# Patient Record
Sex: Female | Born: 1980 | Race: White | Hispanic: No | Marital: Single | Smoking: Never smoker
Health system: Southern US, Community
[De-identification: ages and names within clinical notes are randomized; demographics above are authoritative.]

---

## 2016-08-17 ENCOUNTER — Encounter: Payer: Self-pay | Admitting: Family Medicine

## 2016-08-17 ENCOUNTER — Ambulatory Visit (INDEPENDENT_AMBULATORY_CARE_PROVIDER_SITE_OTHER): Payer: PRIVATE HEALTH INSURANCE | Admitting: Family Medicine

## 2016-08-17 ENCOUNTER — Ambulatory Visit (INDEPENDENT_AMBULATORY_CARE_PROVIDER_SITE_OTHER): Payer: PRIVATE HEALTH INSURANCE

## 2016-08-17 VITALS — BP 131/91 | HR 80 | Temp 97.3°F | Ht 62.0 in | Wt 167.0 lb

## 2016-08-17 DIAGNOSIS — R1030 Lower abdominal pain, unspecified: Secondary | ICD-10-CM

## 2016-08-17 DIAGNOSIS — R319 Hematuria, unspecified: Secondary | ICD-10-CM | POA: Diagnosis not present

## 2016-08-17 LAB — MICROSCOPIC EXAMINATION: RENAL EPITHEL UA: NONE SEEN /HPF

## 2016-08-17 LAB — URINALYSIS, COMPLETE
Bilirubin, UA: NEGATIVE
Glucose, UA: NEGATIVE
Leukocytes, UA: NEGATIVE
NITRITE UA: NEGATIVE
PH UA: 5 (ref 5.0–7.5)
Specific Gravity, UA: 1.03 (ref 1.005–1.030)
Urobilinogen, Ur: 0.2 mg/dL (ref 0.2–1.0)

## 2016-08-17 MED ORDER — HYOSCYAMINE SULFATE 0.125 MG PO TBDP: 0.1250 mg | ORAL_TABLET | ORAL | 5 refills | Status: DC | PRN

## 2016-08-17 NOTE — Progress Notes (Signed)
Subjective:  Patient ID: Jillian Galvan, female    DOB: 1981-07-26  Age: 35 y.o. MRN: [id number]  CC: Hematuria and Abdominal Pain   HPI Jillian Galvan presents for For 5 days of noting blood in her urine. There is some blood on the toilet paper when she wipes and the urine is dark. She denies frequency or dysuria. No urgency. Her periods have been normal last period was one half weeks ago. They have been regular as well. Bowel movements normal. Denies diarrhea constipation. The pain has been a dull ache intermittently in the suprapubic region. These are occasional but radiates into the bilateral flank regions. Patient states she has a high pain tolerance. She mentions this is more of a discomfort than a pain.  History Jillian Galvan has no past medical history on file.   She has no past surgical history on file.   Her family history is not on file.She reports that she has never smoked. She has never used smokeless tobacco. Her alcohol and drug histories are not on file.  No current outpatient prescriptions on file prior to visit.   No current facility-administered medications on file prior to visit.     ROS Review of Systems  Constitutional: Negative for activity change, appetite change and fever.  HENT: Negative for congestion, rhinorrhea and sore throat.   Eyes: Negative for visual disturbance.  Respiratory: Negative for cough and shortness of breath.   Cardiovascular: Negative for chest pain and palpitations.  Gastrointestinal: Negative for abdominal pain, diarrhea and nausea.  Genitourinary: Negative for dysuria.  Musculoskeletal: Negative for arthralgias and myalgias.    Objective:  BP (!) 131/91   Pulse 80   Temp 97.3 F (36.3 C) (Oral)   Ht '5\' 2"'  (1.575 m)   Wt 167 lb (75.8 kg)   BMI 30.54 kg/m   Physical Exam  Constitutional: She is oriented to person, place, and time. She appears well-developed and well-nourished.  HENT:  Head: Normocephalic and atraumatic.    Cardiovascular: Normal rate and regular rhythm.   No murmur heard. Pulmonary/Chest: Effort normal and breath sounds normal. No respiratory distress. She has no wheezes.  Abdominal: Soft. Bowel sounds are normal. She exhibits no distension and no mass. There is tenderness (mild at LUQ & LLQ). There is no rebound and no guarding.  Neurological: She is alert and oriented to person, place, and time.  Skin: Skin is warm and dry.  Psychiatric: She has a normal mood and affect. Her behavior is normal.    Assessment & Plan:   Reshma was seen today for hematuria and abdominal pain.  Diagnoses and all orders for this visit:  Hematuria, unspecified type -     Urinalysis, Complete -     CBC with Differential/Platelet  Lower abdominal pain -     DG Abd 2 Views; Future -     CBC with Differential/Platelet -     CMP14+EGFR  Other orders -     hyoscyamine (ANASPAZ) 0.125 MG TBDP disintergrating tablet; Place 1 tablet (0.125 mg total) under the tongue every 4 (four) hours as needed for cramping (or bloating).   I am having Ms. Simmon start on hyoscyamine.  Meds ordered this encounter  Medications  . hyoscyamine (ANASPAZ) 0.125 MG TBDP disintergrating tablet    Sig: Place 1 tablet (0.125 mg total) under the tongue every 4 (four) hours as needed for cramping (or bloating).    Dispense:  25 tablet    Refill:  5   Reassurred regarding abd  pain.   Follow-up: Return in about 1 month (around 09/16/2016) for abd pain.  [name], M.D.

## 2016-08-18 LAB — CBC WITH DIFFERENTIAL/PLATELET
BASOS ABS: 0 10*3/uL (ref 0.0–0.2)
BASOS: 0 %
EOS (ABSOLUTE): 0.1 10*3/uL (ref 0.0–0.4)
Eos: 3 %
HEMOGLOBIN: 14.1 g/dL (ref 11.1–15.9)
Hematocrit: 41.3 % (ref 34.0–46.6)
IMMATURE GRANS (ABS): 0 10*3/uL (ref 0.0–0.1)
IMMATURE GRANULOCYTES: 0 %
LYMPHS: 30 %
Lymphocytes Absolute: 1.7 10*3/uL (ref 0.7–3.1)
MCH: 29.2 pg (ref 26.6–33.0)
MCHC: 34.1 g/dL (ref 31.5–35.7)
MCV: 86 fL (ref 79–97)
MONOCYTES: 15 %
Monocytes Absolute: 0.9 10*3/uL (ref 0.1–0.9)
NEUTROS PCT: 52 %
Neutrophils Absolute: 3 10*3/uL (ref 1.4–7.0)
PLATELETS: 203 10*3/uL (ref 150–379)
RBC: 4.83 x10E6/uL (ref 3.77–5.28)
RDW: 13.4 % (ref 12.3–15.4)
WBC: 5.7 10*3/uL (ref 3.4–10.8)

## 2016-08-18 LAB — CMP14+EGFR
ALBUMIN: 4.4 g/dL (ref 3.5–5.5)
ALT: 51 IU/L — AB (ref 0–32)
AST: 32 IU/L (ref 0–40)
Albumin/Globulin Ratio: 1.8 (ref 1.2–2.2)
Alkaline Phosphatase: 91 IU/L (ref 39–117)
BUN/Creatinine Ratio: 21 (ref 9–23)
BUN: 16 mg/dL (ref 6–20)
Bilirubin Total: 0.4 mg/dL (ref 0.0–1.2)
CALCIUM: 9.2 mg/dL (ref 8.7–10.2)
CO2: 24 mmol/L (ref 18–29)
CREATININE: 0.77 mg/dL (ref 0.57–1.00)
Chloride: 101 mmol/L (ref 96–106)
GFR, EST AFRICAN AMERICAN: 117 mL/min/{1.73_m2} (ref >59)
GFR, EST NON AFRICAN AMERICAN: 102 mL/min/{1.73_m2} (ref >59)
GLUCOSE: 87 mg/dL (ref 65–99)
Globulin, Total: 2.5 g/dL (ref 1.5–4.5)
Potassium: 4.1 mmol/L (ref 3.5–5.2)
Sodium: 140 mmol/L (ref 134–144)
TOTAL PROTEIN: 6.9 g/dL (ref 6.0–8.5)

## 2018-04-26 IMAGING — DX DG ABDOMEN 2V
2 series · 2 of 2 positions shown · non-contrast
Comparison: None.

CLINICAL DATA: Intermittent abdominal pain over the last 2 months

EXAM:
ABDOMEN - 2 VIEW

[abdomen erect]
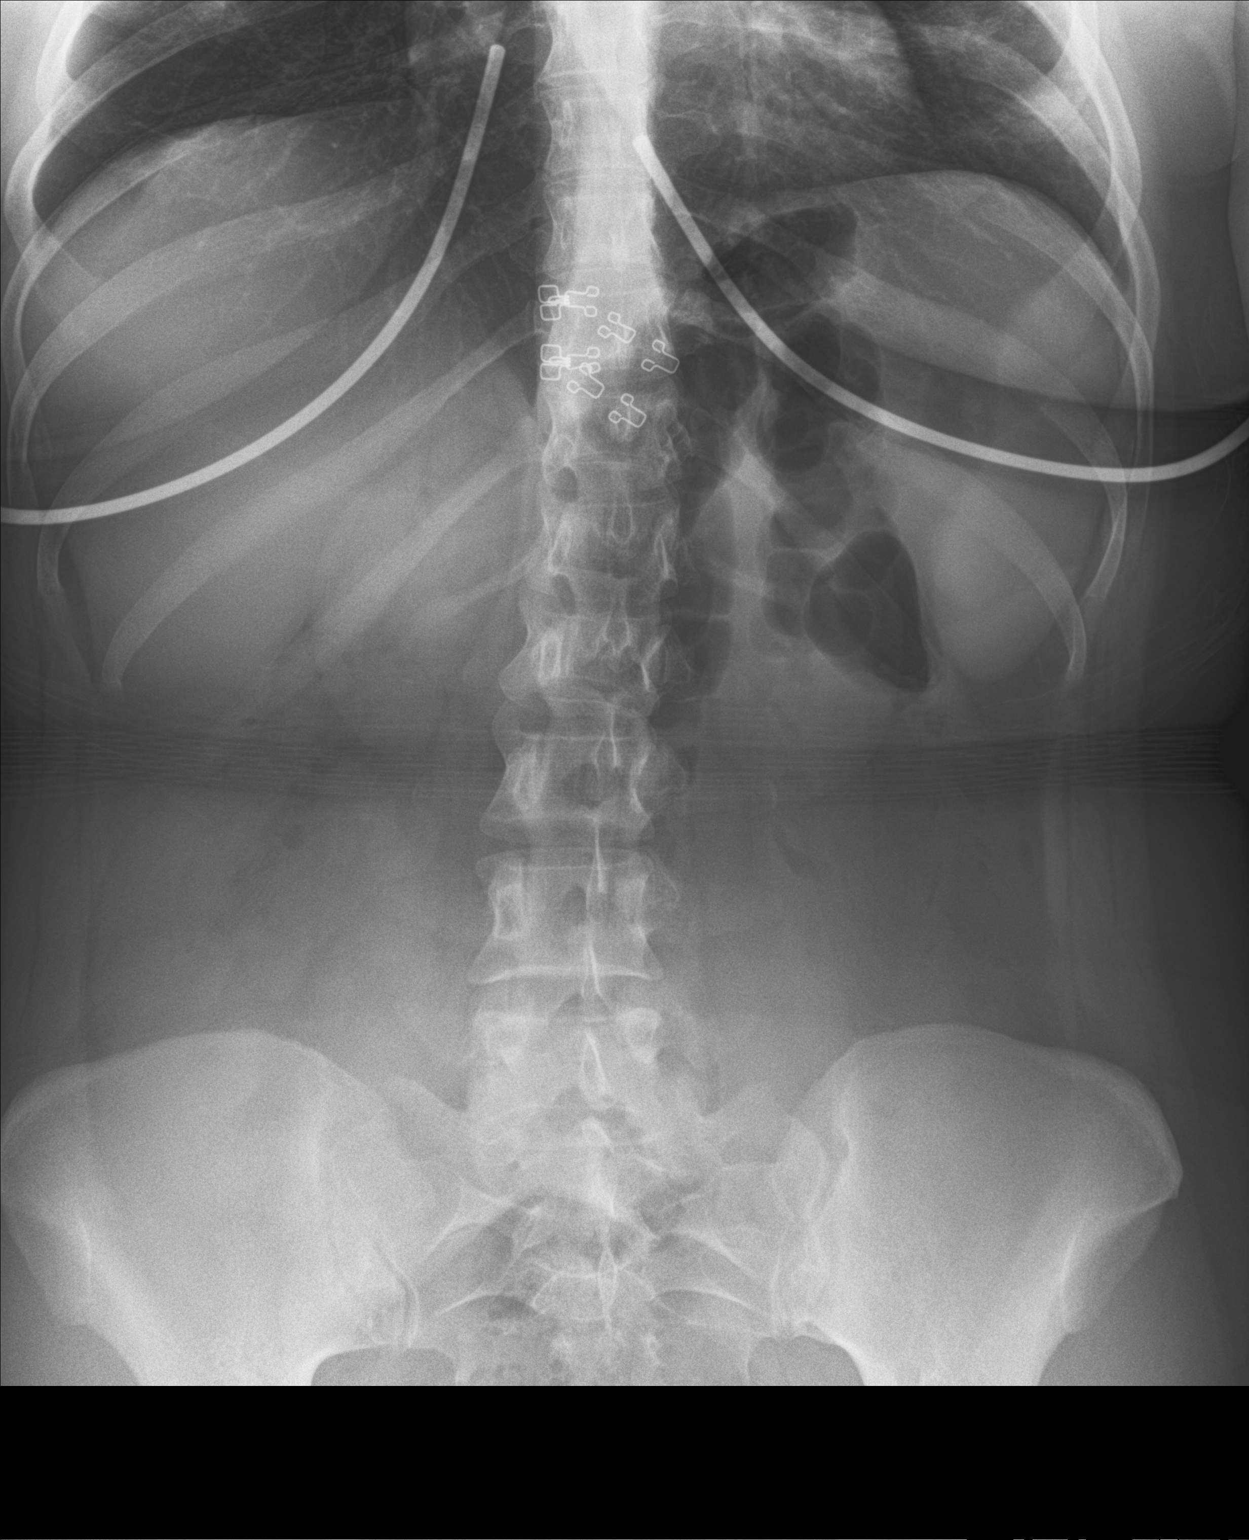

[abdomen supine]
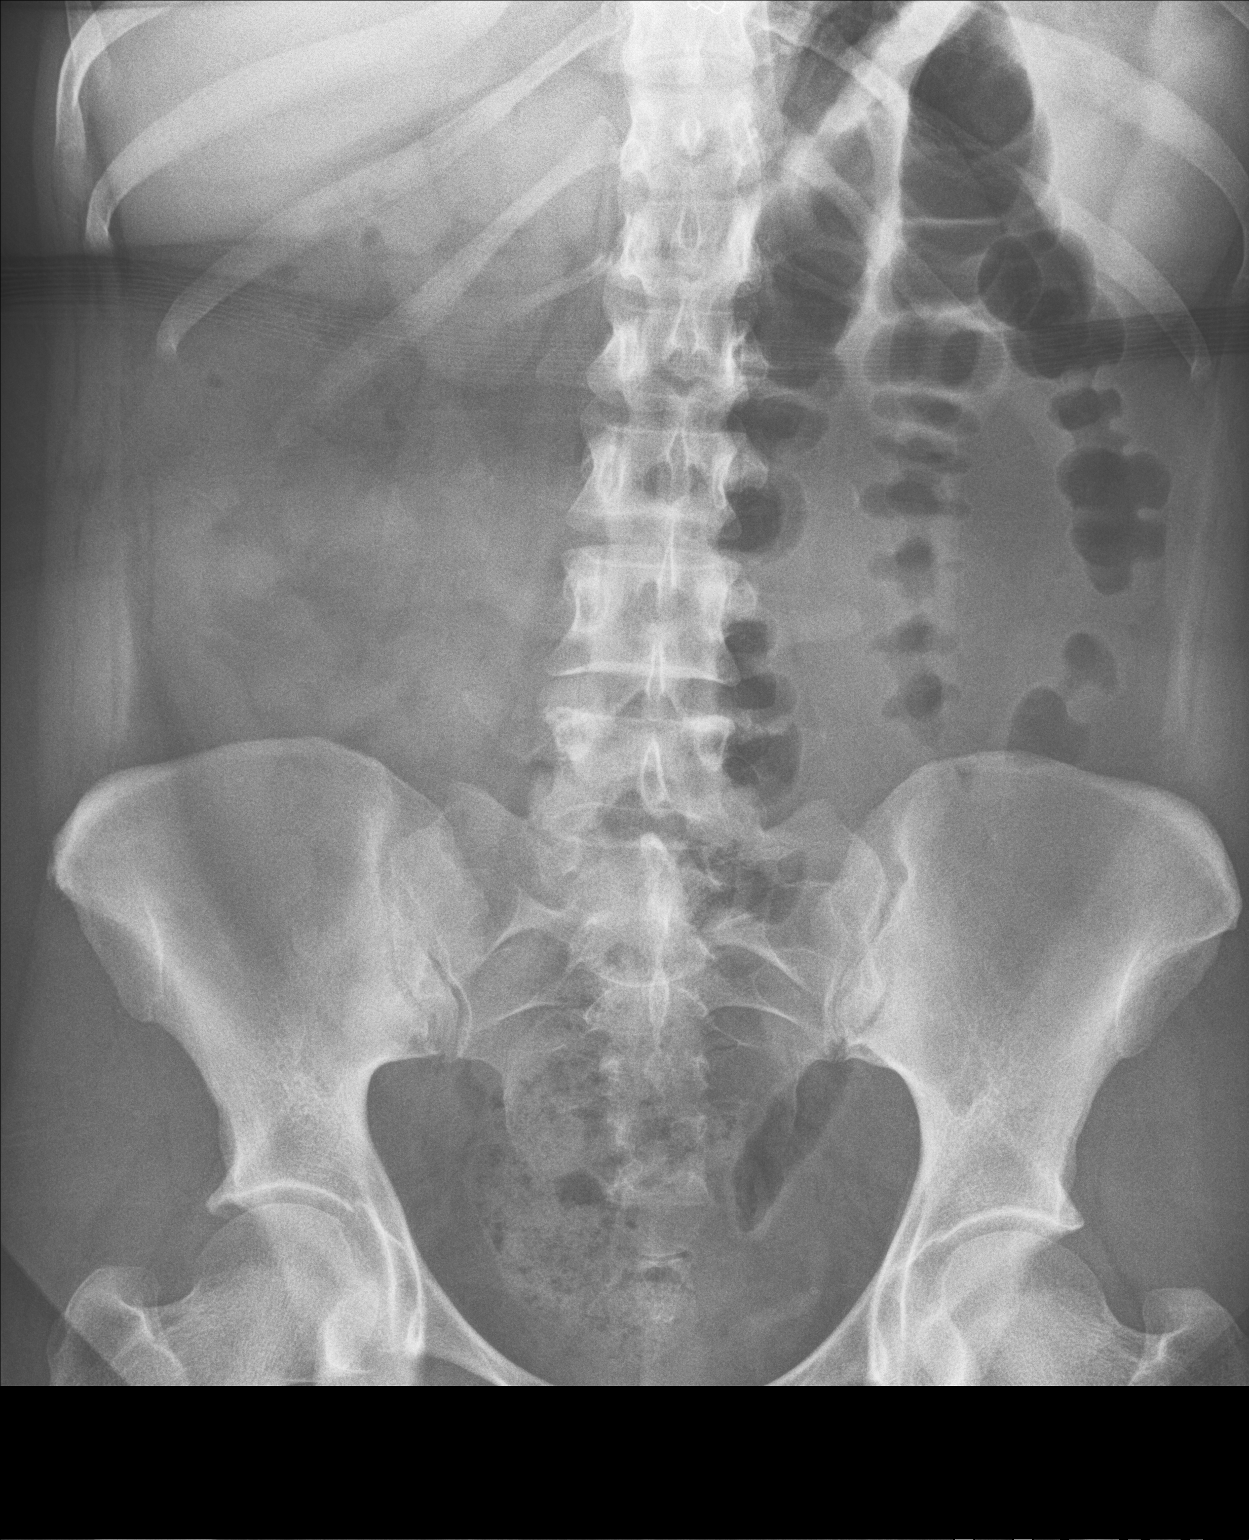

[2 of 2 positions shown; findings below may reference images not displayed]

FINDINGS: Supine and erect views the abdomen show no bowel obstruction. No
free air seen on the erect view. No opaque calculi are noted. The
bones are unremarkable.
IMPRESSION: No bowel obstruction.  No free air.

## 2018-07-06 ENCOUNTER — Encounter: Payer: Self-pay | Admitting: Pediatrics

## 2018-07-06 ENCOUNTER — Ambulatory Visit (INDEPENDENT_AMBULATORY_CARE_PROVIDER_SITE_OTHER): Payer: PRIVATE HEALTH INSURANCE | Admitting: Pediatrics

## 2018-07-06 VITALS — BP 125/84 | HR 77 | Temp 98.4°F | Ht 62.0 in | Wt 169.4 lb

## 2018-07-06 DIAGNOSIS — Z0000 Encounter for general adult medical examination without abnormal findings: Secondary | ICD-10-CM

## 2018-07-06 DIAGNOSIS — Z0001 Encounter for general adult medical examination with abnormal findings: Secondary | ICD-10-CM

## 2018-07-06 DIAGNOSIS — N920 Excessive and frequent menstruation with regular cycle: Secondary | ICD-10-CM | POA: Diagnosis not present

## 2018-07-06 DIAGNOSIS — Z6830 Body mass index (BMI) 30.0-30.9, adult: Secondary | ICD-10-CM

## 2018-07-06 LAB — WET PREP FOR TRICH, YEAST, CLUE
CLUE CELL EXAM: POSITIVE — AB
TRICHOMONAS EXAM: NEGATIVE
YEAST EXAM: NEGATIVE

## 2018-07-06 LAB — PREGNANCY, URINE: PREG TEST UR: NEGATIVE

## 2018-07-06 MED ORDER — NORGESTIMATE-ETH ESTRADIOL 0.25-35 MG-MCG PO TABS: 1.0000 | ORAL_TABLET | Freq: Every day | ORAL | 3 refills | Status: DC

## 2018-07-06 NOTE — Progress Notes (Signed)
  Subjective:   Patient ID: Jillian Galvan, female    DOB: 05-13-1981, 37 y.o.   MRN: [id number] CC: Annual Exam and Gynecologic Exam  HPI: Jillian Galvan is a 37 y.o. female   LMP: 3 weeks ago, heavy periods.  Interested in starting birth control to help with menorrhagia.  No personal history of migraines, no personal or family history of blood clots  Has been feeling well overall.  No fevers, trying to stay active.  PGM: breast cancer at age 46yo  Relevant past medical, surgical, family and social history reviewed. Allergies and medications reviewed and updated. Social History   Tobacco Use  Smoking Status Never Smoker  Smokeless Tobacco Never Used   ROS: All systems negative other than what is in the HPI  Objective:    BP 125/84   Pulse 77   Temp 98.4 F (36.9 C) (Oral)   Ht '5\' 2"'$  (1.575 m)   Wt 169 lb 6.4 oz (76.8 kg)   LMP 06/15/2018   BMI 30.98 kg/m   Wt Readings from Last 3 Encounters:  07/06/18 169 lb 6.4 oz (76.8 kg)  08/17/16 167 lb (75.8 kg)    Gen: NAD, alert, cooperative with exam, NCAT EYES: EOMI, no conjunctival injection, or no icterus ENT:  TMs pearly gray b/l, OP without erythema LYMPH: no cervical LAD CV: NRRR, normal S1/S2, no murmur, distal pulses 2+ b/l Resp: CTABL, no wheezes, normal WOB Abd: +BS, soft, NTND. no guarding or organomegaly Ext: No edema, warm Neuro: Alert and oriented, strength equal b/l UE and LE, coordination grossly normal MSK: normal muscle bulk GU: Normal external female genitalia, small to moderate amount of white thick discharge in vaginal vault.  Normal-appearing cervix.  Assessment & Plan:  Jillian Galvan was seen today for annual exam and gynecologic exam.  Diagnoses and all orders for this visit:  Encounter for preventive care -     CMP14+EGFR -     CBC with Differential/Platelet -     TSH -     Pap IG and HPV (high risk) DNA detection -     WET PREP FOR TRICH, YEAST, CLUE  Menorrhagia with regular cycle Start below.  Not  interested in pregnancy at this time, patient is sexually active.  Side effects discussed. -     norgestimate-ethinyl estradiol (ORTHO-CYCLEN, 28,) 0.25-35 MG-MCG tablet; Take 1 tablet by mouth daily. -     Pregnancy, urine -     WET PREP FOR TRICH, YEAST, CLUE  BMI 30.0-30.9,adult Continue lifestyle modifications, avoiding sugary drinks  Follow up plan: Return in about 1 year (around 07/07/2019). Assunta Found, MD [place]

## 2018-07-07 LAB — CBC WITH DIFFERENTIAL/PLATELET
BASOS: 0 %
Basophils Absolute: 0 10*3/uL (ref 0.0–0.2)
EOS (ABSOLUTE): 0.1 10*3/uL (ref 0.0–0.4)
EOS: 1 %
HEMATOCRIT: 40.3 % (ref 34.0–46.6)
HEMOGLOBIN: 13.6 g/dL (ref 11.1–15.9)
Immature Grans (Abs): 0 10*3/uL (ref 0.0–0.1)
Immature Granulocytes: 0 %
LYMPHS ABS: 2.3 10*3/uL (ref 0.7–3.1)
Lymphs: 20 %
MCH: 30.1 pg (ref 26.6–33.0)
MCHC: 33.7 g/dL (ref 31.5–35.7)
MCV: 89 fL (ref 79–97)
MONOS ABS: 0.7 10*3/uL (ref 0.1–0.9)
Monocytes: 6 %
NEUTROS ABS: 8.5 10*3/uL — AB (ref 1.4–7.0)
Neutrophils: 73 %
Platelets: 245 10*3/uL (ref 150–450)
RBC: 4.52 x10E6/uL (ref 3.77–5.28)
RDW: 12.6 % (ref 12.3–15.4)
WBC: 11.7 10*3/uL — AB (ref 3.4–10.8)

## 2018-07-07 LAB — CMP14+EGFR
ALBUMIN: 4.1 g/dL (ref 3.5–5.5)
ALK PHOS: 81 IU/L (ref 39–117)
ALT: 29 IU/L (ref 0–32)
AST: 23 IU/L (ref 0–40)
Albumin/Globulin Ratio: 1.7 (ref 1.2–2.2)
BUN / CREAT RATIO: 24 — AB (ref 9–23)
BUN: 20 mg/dL (ref 6–20)
Bilirubin Total: <0.2 mg/dL (ref 0.0–1.2)
CO2: 23 mmol/L (ref 20–29)
CREATININE: 0.85 mg/dL (ref 0.57–1.00)
Calcium: 9.1 mg/dL (ref 8.7–10.2)
Chloride: 105 mmol/L (ref 96–106)
GFR, EST AFRICAN AMERICAN: 103 mL/min/{1.73_m2} (ref >59)
GFR, EST NON AFRICAN AMERICAN: 90 mL/min/{1.73_m2} (ref >59)
GLOBULIN, TOTAL: 2.4 g/dL (ref 1.5–4.5)
Glucose: 79 mg/dL (ref 65–99)
Potassium: 4.6 mmol/L (ref 3.5–5.2)
SODIUM: 141 mmol/L (ref 134–144)
TOTAL PROTEIN: 6.5 g/dL (ref 6.0–8.5)

## 2018-07-07 LAB — TSH: TSH: 1.44 u[IU]/mL (ref 0.450–4.500)

## 2018-07-10 ENCOUNTER — Other Ambulatory Visit: Payer: Self-pay | Admitting: Pediatrics

## 2018-07-10 DIAGNOSIS — N760 Acute vaginitis: Principal | ICD-10-CM

## 2018-07-10 DIAGNOSIS — B9689 Other specified bacterial agents as the cause of diseases classified elsewhere: Secondary | ICD-10-CM

## 2018-07-10 MED ORDER — METRONIDAZOLE 500 MG PO TABS: 500.0000 mg | ORAL_TABLET | Freq: Two times a day (BID) | ORAL | 0 refills | Status: DC

## 2018-07-11 LAB — PAP IG AND HPV HIGH-RISK
HPV, HIGH-RISK: NEGATIVE
PAP SMEAR COMMENT: 0

## 2018-10-16 ENCOUNTER — Other Ambulatory Visit: Payer: Self-pay | Admitting: Pediatrics

## 2018-10-16 DIAGNOSIS — N920 Excessive and frequent menstruation with regular cycle: Secondary | ICD-10-CM

## 2019-01-12 ENCOUNTER — Other Ambulatory Visit: Payer: Self-pay | Admitting: Pediatrics

## 2019-01-12 DIAGNOSIS — N920 Excessive and frequent menstruation with regular cycle: Secondary | ICD-10-CM

## 2019-01-28 ENCOUNTER — Other Ambulatory Visit: Payer: Self-pay

## 2019-01-28 ENCOUNTER — Ambulatory Visit (INDEPENDENT_AMBULATORY_CARE_PROVIDER_SITE_OTHER): Payer: PRIVATE HEALTH INSURANCE | Admitting: Family Medicine

## 2019-01-28 VITALS — Temp 98.2°F

## 2019-01-28 DIAGNOSIS — K219 Gastro-esophageal reflux disease without esophagitis: Secondary | ICD-10-CM

## 2019-01-28 MED ORDER — PANTOPRAZOLE SODIUM 40 MG PO TBEC: 40.0000 mg | DELAYED_RELEASE_TABLET | Freq: Every day | ORAL | 1 refills | Status: DC

## 2019-01-28 NOTE — Progress Notes (Signed)
Telephone visit  Subjective: CC: stomach pain PCP: Raliegh Ip, DO BEE:FEOFH [name] is a 38 y.o. female calls for telephone consult today. Patient provides verbal consent for consult held via phone.  Location of patient: home Location of provider: [facility] Others present for call: none  1. Stomach pain Patient reports that she developed epigastric pain, described as a burning in her stomach with associated nausea yesterday morning.  She called out of work and subsequently is contacting me for medical clearance.  She reports having felt somewhat chilled during the pain episode but that since resolved.  No measured fevers.  Her temperature was 98.2 F today.  She has been using Tums and Pepcid but this does not seem to help.  She has a medical history significant for acid reflux symptoms but again has always used over-the-counter medications that are now not helping.  She had dry heaving but no actual vomiting.  No hematochezia or melena.  No undercooked foods, foods left out too long or untreated water.  No sick contacts.  Last menstrual cycle was 01/17/2019.  She uses OCPs consistently for contraception.   ROS: Per HPI  Allergies  Allergen Reactions  . Penicillins    No past medical history on file.  Current Outpatient Medications:  .  SPRINTEC 28 0.25-35 MG-MCG tablet, TAKE 1 TABLET BY MOUTH EVERY DAY, Disp: 84 tablet, Rfl: 1  Temperature 98.2 F (36.8 C), last menstrual period 01/16/2019.  Assessment/ Plan: 38 y.o. female   1. Gastroesophageal reflux disease without esophagitis Start pantoprazole 40 mg daily.  Instructed her to use this for 2 weeks.  She may then discontinue.  If symptoms resolve, okay to restart intake daily.  She will follow-up PRN - pantoprazole (PROTONIX) 40 MG tablet; Take 1 tablet (40 mg total) by mouth daily.  Dispense: 30 tablet; Refill: 1   Start time: 2:39pm End time: 2:47pm  Total time spent on patient care (including telephone call/ virtual  visit): 15 minutes  Jillian Musgrave Hulen Skains, DO [facility] [phone]

## 2019-02-19 ENCOUNTER — Other Ambulatory Visit: Payer: Self-pay | Admitting: Family Medicine

## 2019-02-19 DIAGNOSIS — K219 Gastro-esophageal reflux disease without esophagitis: Secondary | ICD-10-CM

## 2019-02-19 MED ORDER — PANTOPRAZOLE SODIUM 40 MG PO TBEC: 40.0000 mg | DELAYED_RELEASE_TABLET | Freq: Every day | ORAL | 0 refills | Status: DC

## 2019-02-19 NOTE — Telephone Encounter (Signed)
Refill failed, resent 

## 2019-02-19 NOTE — Addendum Note (Signed)
Addended by: Julious Payer D on: 02/19/2019 04:41 PM   Modules accepted: Orders

## 2021-08-04 DIAGNOSIS — J020 Streptococcal pharyngitis: Secondary | ICD-10-CM | POA: Diagnosis not present

## 2021-08-04 DIAGNOSIS — J101 Influenza due to other identified influenza virus with other respiratory manifestations: Secondary | ICD-10-CM | POA: Diagnosis not present

## 2021-08-04 DIAGNOSIS — Z6830 Body mass index (BMI) 30.0-30.9, adult: Secondary | ICD-10-CM | POA: Diagnosis not present

## 2022-01-12 DIAGNOSIS — J0100 Acute maxillary sinusitis, unspecified: Secondary | ICD-10-CM | POA: Diagnosis not present

## 2022-01-12 DIAGNOSIS — H6983 Other specified disorders of Eustachian tube, bilateral: Secondary | ICD-10-CM | POA: Diagnosis not present

## 2022-01-12 DIAGNOSIS — Z6832 Body mass index (BMI) 32.0-32.9, adult: Secondary | ICD-10-CM | POA: Diagnosis not present

## 2022-02-10 DIAGNOSIS — Z6830 Body mass index (BMI) 30.0-30.9, adult: Secondary | ICD-10-CM | POA: Diagnosis not present

## 2022-02-10 DIAGNOSIS — R070 Pain in throat: Secondary | ICD-10-CM | POA: Diagnosis not present

## 2022-02-10 DIAGNOSIS — J0100 Acute maxillary sinusitis, unspecified: Secondary | ICD-10-CM | POA: Diagnosis not present

## 2022-02-10 DIAGNOSIS — J020 Streptococcal pharyngitis: Secondary | ICD-10-CM | POA: Diagnosis not present

## 2022-06-09 DIAGNOSIS — Z6832 Body mass index (BMI) 32.0-32.9, adult: Secondary | ICD-10-CM | POA: Diagnosis not present

## 2022-06-09 DIAGNOSIS — J00 Acute nasopharyngitis [common cold]: Secondary | ICD-10-CM | POA: Diagnosis not present

## 2022-10-25 DIAGNOSIS — K219 Gastro-esophageal reflux disease without esophagitis: Secondary | ICD-10-CM | POA: Diagnosis not present

## 2022-10-25 DIAGNOSIS — R109 Unspecified abdominal pain: Secondary | ICD-10-CM | POA: Diagnosis not present

## 2022-10-25 DIAGNOSIS — R110 Nausea: Secondary | ICD-10-CM | POA: Diagnosis not present

## 2022-10-25 DIAGNOSIS — K760 Fatty (change of) liver, not elsewhere classified: Secondary | ICD-10-CM | POA: Diagnosis not present

## 2022-10-25 DIAGNOSIS — Z880 Allergy status to penicillin: Secondary | ICD-10-CM | POA: Diagnosis not present

## 2022-10-25 DIAGNOSIS — R1012 Left upper quadrant pain: Secondary | ICD-10-CM | POA: Diagnosis not present

## 2022-11-10 DIAGNOSIS — T7840XA Allergy, unspecified, initial encounter: Secondary | ICD-10-CM | POA: Diagnosis not present

## 2023-01-16 DIAGNOSIS — J0100 Acute maxillary sinusitis, unspecified: Secondary | ICD-10-CM | POA: Diagnosis not present

## 2023-01-16 DIAGNOSIS — J029 Acute pharyngitis, unspecified: Secondary | ICD-10-CM | POA: Diagnosis not present

## 2023-04-04 DIAGNOSIS — Z880 Allergy status to penicillin: Secondary | ICD-10-CM | POA: Diagnosis not present

## 2023-04-04 DIAGNOSIS — M79671 Pain in right foot: Secondary | ICD-10-CM | POA: Diagnosis not present

## 2023-04-06 ENCOUNTER — Ambulatory Visit (INDEPENDENT_AMBULATORY_CARE_PROVIDER_SITE_OTHER): Payer: BC Managed Care – PPO | Admitting: Family Medicine

## 2023-04-06 ENCOUNTER — Encounter: Payer: Self-pay | Admitting: Family Medicine

## 2023-04-06 VITALS — BP 129/86 | HR 85 | Temp 98.5°F | Ht 62.0 in | Wt 182.0 lb

## 2023-04-06 DIAGNOSIS — M79671 Pain in right foot: Secondary | ICD-10-CM | POA: Diagnosis not present

## 2023-04-06 MED ORDER — NAPROXEN 500 MG PO TABS
500.0000 mg | ORAL_TABLET | Freq: Two times a day (BID) | ORAL | 0 refills | Status: AC
Start: 2023-04-06 — End: ?

## 2023-04-06 NOTE — Progress Notes (Signed)
Acute Office Visit  Subjective:  Patient ID: Jillian Galvan, female    DOB: 04-Oct-1980, 42 y.o.   MRN: [id number]  Chief Complaint  Patient presents with   Follow-up    Right foot pain   HPI Patient is in today for continued right foot pain.  Tried elevation, has not tried OTC analgesics.  Started 3 weeks ago. Worsened on Tuesday morning when she went to ED. States that it was difficult to place pressure on her feet, difficult to drive due to movement of foot.  States that at work on Tuesday she felt a bad "pop" in her foot and wonders if she damaged a ligament.  Reports pain is in top middle, through to the ball of her foot. Pain radiates to toes.  Swelling has decreased, reports that swelling is more common in am States that pain is sometimes worse in morning than night. Depends on how she is walking through the day on her pain level.   ROS As per HPI Objective:  BP 129/86   Pulse 85   Temp 98.5 F (36.9 C)   Ht 5\' 2"  (1.575 m)   Wt 182 lb (82.6 kg)   LMP 03/30/2023 (Exact Date)   SpO2 95%   BMI 33.29 kg/m   Physical Exam Constitutional:      General: She is awake. She is not in acute distress.    Appearance: Normal appearance. She is well-developed, well-groomed and overweight. She is not ill-appearing, toxic-appearing or diaphoretic.  Cardiovascular:     Rate and Rhythm: Normal rate and regular rhythm.     Pulses: Normal pulses.          Radial pulses are 2+ on the right side and 2+ on the left side.       Posterior tibial pulses are 2+ on the right side and 2+ on the left side.     Heart sounds: Normal heart sounds. No murmur heard.    No gallop.  Pulmonary:     Effort: Pulmonary effort is normal. No respiratory distress.     Breath sounds: Normal breath sounds. No stridor. No wheezing, rhonchi or rales.  Musculoskeletal:     Cervical back: Full passive range of motion without pain and neck supple.     Right lower leg: No edema.     Left lower leg: No edema.      Right foot: Decreased range of motion. Normal capillary refill. Swelling present. No deformity, bunion, Charcot foot, foot drop, prominent metatarsal heads, laceration, tenderness, bony tenderness or crepitus. Normal pulse.     Comments: Decreased ROM due to pain.  Slight swelling on top of foot   Skin:    General: Skin is warm.     Capillary Refill: Capillary refill takes less than 2 seconds.  Neurological:     General: No focal deficit present.     Mental Status: She is alert, oriented to person, place, and time and easily aroused. Mental status is at baseline.     GCS: GCS eye subscore is 4. GCS verbal subscore is 5. GCS motor subscore is 6.     Motor: No weakness.  Psychiatric:        Attention and Perception: Attention and perception normal.        Mood and Affect: Mood and affect normal.        Speech: Speech normal.        Behavior: Behavior normal. Behavior is cooperative.        Thought  Content: Thought content normal. Thought content does not include homicidal or suicidal ideation. Thought content does not include homicidal or suicidal plan.        Cognition and Memory: Cognition and memory normal.        Judgment: Judgment normal.       04/06/2023    2:41 PM 07/06/2018    3:48 PM 08/17/2016    2:12 PM  Depression screen PHQ 2/9  Decreased Interest 0 0 0  Down, Depressed, Hopeless 0 0 0  PHQ - 2 Score 0 0 0  Altered sleeping 0    Tired, decreased energy 0    Change in appetite 0    Feeling bad or failure about yourself  0    Trouble concentrating 0    Moving slowly or fidgety/restless 0    Suicidal thoughts 0    PHQ-9 Score 0    Difficult doing work/chores Not difficult at all        04/06/2023    2:41 PM  GAD 7 : Generalized Anxiety Score  Nervous, Anxious, on Edge 0  Control/stop worrying 0  Worry too much - different things 0  Trouble relaxing 0  Restless 0  Easily annoyed or irritable 0  Afraid - awful might happen 0  Total GAD 7 Score 0  Anxiety  Difficulty Not difficult at all   Assessment & Plan:  1. Right foot pain Medication as below for pain and swelling. Reviewed GFR 10/25/22. Referral placed as below for evaluation for advanced imaging.  Reviewed imaging from 04/04/23 from [facility]  - naproxen (NAPROSYN) 500 MG tablet; Take 1 tablet (500 mg total) by mouth 2 (two) times daily with a meal.  Dispense: 30 tablet; Refill: 0 - Ambulatory referral to Orthopedic Surgery  The above assessment and management plan was discussed with the patient. The patient verbalized understanding of and has agreed to the management plan using shared-decision making. Patient is aware to call the clinic if they develop any new symptoms or if symptoms fail to improve or worsen. Patient is aware when to return to the clinic for a follow-up visit. Patient educated on when it is appropriate to go to the emergency department.   Return if symptoms worsen or fail to improve.  Neale Burly, DNP-FNP [facility] [address] [place], [place] [address] [phone]

## 2023-04-20 ENCOUNTER — Encounter: Payer: Self-pay | Admitting: Orthopedic Surgery

## 2023-04-20 ENCOUNTER — Ambulatory Visit (INDEPENDENT_AMBULATORY_CARE_PROVIDER_SITE_OTHER): Payer: BC Managed Care – PPO | Admitting: Orthopedic Surgery

## 2023-04-20 VITALS — BP 136/80 | HR 96 | Ht 62.0 in | Wt 180.0 lb

## 2023-04-20 DIAGNOSIS — M79671 Pain in right foot: Secondary | ICD-10-CM

## 2023-04-20 NOTE — Patient Instructions (Signed)
We are referring you to [facility] from [facility] address is [place] The phone number is [phone]  The office will call you with an appointment Dr. Sharol Given

## 2023-04-20 NOTE — Progress Notes (Addendum)
Chief Complaint  Patient presents with   Foot Pain    Right/ for about 3 week s   Problem list, medical hx, medications and allergies reviewed   42 year old female with metatarsal area pain  She reports that she was having some pain in her foot while at work.  Then she noticed sometime after that that something popped in the foot  She now complains of pain at the third and fourth metatarsal heads which is described as a dull full aching feeling with some components of occasional tingling.  She did try to change her shoewear.  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  BP 136/80   Pulse 96   Ht 5\' 2"  (1.575 m)   Wt 180 lb (81.6 kg)   LMP 03/30/2023 (Exact Date)   BMI 32.92 kg/m   Physical Exam Vitals and nursing note reviewed.  Constitutional:      Appearance: Normal appearance.  HENT:     Head: Normocephalic and atraumatic.  Eyes:     General: No scleral icterus.       Right eye: No discharge.        Left eye: No discharge.     Extraocular Movements: Extraocular movements intact.     Conjunctiva/sclera: Conjunctivae normal.     Pupils: Pupils are equal, round, and reactive to light.  Cardiovascular:     Rate and Rhythm: Normal rate.     Pulses: Normal pulses.  Skin:    General: Skin is warm and dry.     Capillary Refill: Capillary refill takes less than 2 seconds.  Neurological:     General: No focal deficit present.     Mental Status: She is alert and oriented to person, place, and time.  Psychiatric:        Mood and Affect: Mood normal.        Behavior: Behavior normal.        Thought Content: Thought content normal.        Judgment: Judgment normal.     Examination reveals a slightly flattened arch but it is flexible Achilles, plantar fascia area nontender ankle stable no swelling  Tenderness noted under the third metatarsal head and fourth metatarsal head and deep palpation between the third and fourth  metatarsals.  Squeezing the metatarsals together does not reproduce the pain there is no click  Her [facility] was at [facility] it was reportedly negative I do not have a copy  Differential diagnosis  Morton's neuroma Metatarsalgia Stress fracture Synovitis MTP  Unclear at this time what the diagnosis is.  She does not want to take oral medication so anti-inflammatories are not going to be recommended.  She is already changed the shoe-wear  Recommend foot and ankle specialist for evaluation and management

## 2023-05-02 ENCOUNTER — Telehealth: Payer: Self-pay | Admitting: Orthopaedic Surgery

## 2023-05-02 NOTE — Telephone Encounter (Signed)
Patient did not wish to see Dr Lajoyce Corners / Lorain Childes only

## 2023-05-04 ENCOUNTER — Ambulatory Visit: Payer: BC Managed Care – PPO | Admitting: Orthopedic Surgery

## 2023-10-20 DIAGNOSIS — Z880 Allergy status to penicillin: Secondary | ICD-10-CM | POA: Diagnosis not present

## 2023-10-20 DIAGNOSIS — R29818 Other symptoms and signs involving the nervous system: Secondary | ICD-10-CM | POA: Diagnosis not present

## 2023-10-20 DIAGNOSIS — R1011 Right upper quadrant pain: Secondary | ICD-10-CM | POA: Diagnosis not present

## 2023-10-20 DIAGNOSIS — K760 Fatty (change of) liver, not elsewhere classified: Secondary | ICD-10-CM | POA: Diagnosis not present

## 2023-10-20 DIAGNOSIS — R1031 Right lower quadrant pain: Secondary | ICD-10-CM | POA: Diagnosis not present

## 2023-10-20 DIAGNOSIS — Z79899 Other long term (current) drug therapy: Secondary | ICD-10-CM | POA: Diagnosis not present

## 2023-10-20 DIAGNOSIS — I771 Stricture of artery: Secondary | ICD-10-CM | POA: Diagnosis not present

## 2023-10-20 DIAGNOSIS — R200 Anesthesia of skin: Secondary | ICD-10-CM | POA: Diagnosis not present

## 2023-10-21 DIAGNOSIS — R200 Anesthesia of skin: Secondary | ICD-10-CM | POA: Diagnosis not present

## 2023-10-21 DIAGNOSIS — R1011 Right upper quadrant pain: Secondary | ICD-10-CM | POA: Diagnosis not present

## 2023-10-21 DIAGNOSIS — K760 Fatty (change of) liver, not elsewhere classified: Secondary | ICD-10-CM | POA: Diagnosis not present

## 2023-10-21 DIAGNOSIS — R29818 Other symptoms and signs involving the nervous system: Secondary | ICD-10-CM | POA: Diagnosis not present

## 2023-12-18 ENCOUNTER — Other Ambulatory Visit (HOSPITAL_COMMUNITY): Payer: Self-pay

## 2023-12-18 DIAGNOSIS — H6993 Unspecified Eustachian tube disorder, bilateral: Secondary | ICD-10-CM | POA: Diagnosis not present

## 2023-12-18 DIAGNOSIS — J302 Other seasonal allergic rhinitis: Secondary | ICD-10-CM | POA: Diagnosis not present

## 2024-02-07 DIAGNOSIS — H9203 Otalgia, bilateral: Secondary | ICD-10-CM | POA: Diagnosis not present

## 2024-05-10 ENCOUNTER — Encounter: Payer: Self-pay | Admitting: Radiology

## 2024-07-22 ENCOUNTER — Encounter: Payer: Self-pay | Admitting: Radiology
